# Patient Record
Sex: Female | Born: 1965 | Hispanic: No | Marital: Married | Smoking: Never smoker
Health system: Southern US, Community
[De-identification: ages and names within clinical notes are randomized; demographics above are authoritative.]

## PROBLEM LIST (undated history)

## (undated) DIAGNOSIS — I10 Essential (primary) hypertension: Secondary | ICD-10-CM

## (undated) DIAGNOSIS — O1490 Unspecified pre-eclampsia, unspecified trimester: Secondary | ICD-10-CM

## (undated) DIAGNOSIS — E079 Disorder of thyroid, unspecified: Secondary | ICD-10-CM

## (undated) HISTORY — DX: Disorder of thyroid, unspecified: E07.9

## (undated) HISTORY — DX: Unspecified pre-eclampsia, unspecified trimester: O14.90

## (undated) HISTORY — DX: Essential (primary) hypertension: I10

---

## 2004-04-18 ENCOUNTER — Ambulatory Visit: Payer: Self-pay | Admitting: Internal Medicine

## 2004-09-25 ENCOUNTER — Ambulatory Visit: Payer: Self-pay | Admitting: Internal Medicine

## 2005-06-18 ENCOUNTER — Ambulatory Visit: Payer: Self-pay | Admitting: Internal Medicine

## 2005-06-25 ENCOUNTER — Ambulatory Visit: Payer: Self-pay | Admitting: Internal Medicine

## 2006-02-25 ENCOUNTER — Ambulatory Visit: Payer: Self-pay | Admitting: Internal Medicine

## 2006-03-16 ENCOUNTER — Ambulatory Visit: Payer: Self-pay | Admitting: Internal Medicine

## 2006-03-26 ENCOUNTER — Emergency Department (HOSPITAL_COMMUNITY): Admission: EM | Admit: 2006-03-26 | Discharge: 2006-03-26 | Payer: Self-pay | Admitting: Emergency Medicine

## 2006-03-27 ENCOUNTER — Ambulatory Visit: Payer: Self-pay | Admitting: Internal Medicine

## 2006-04-01 ENCOUNTER — Ambulatory Visit: Payer: Self-pay | Admitting: Internal Medicine

## 2006-04-29 ENCOUNTER — Ambulatory Visit: Payer: Self-pay | Admitting: Internal Medicine

## 2006-07-02 ENCOUNTER — Ambulatory Visit: Payer: Self-pay | Admitting: Internal Medicine

## 2006-07-02 LAB — CONVERTED CEMR LAB
ALT: 31 units/L (ref 0–40)
AST: 27 units/L (ref 0–37)
Albumin: 3.8 g/dL (ref 3.5–5.2)
Alkaline Phosphatase: 96 units/L (ref 39–117)
BUN: 7 mg/dL (ref 6–23)
Basophils Absolute: 0.1 10*3/uL (ref 0.0–0.1)
Basophils Relative: 1 % (ref 0.0–1.0)
CO2: 30 meq/L (ref 19–32)
Calcium: 9.2 mg/dL (ref 8.4–10.5)
Chloride: 105 meq/L (ref 96–112)
Cholesterol: 182 mg/dL (ref 0–200)
Creatinine, Ser: 0.6 mg/dL (ref 0.4–1.2)
Eosinophils Relative: 3.7 % (ref 0.0–5.0)
GFR calc Af Amer: 142 mL/min
GFR calc non Af Amer: 117 mL/min
Glucose, Bld: 94 mg/dL (ref 70–99)
HCT: 38.4 % (ref 36.0–46.0)
HDL: 50.8 mg/dL (ref >39.0)
Hemoglobin: 12.7 g/dL (ref 12.0–15.0)
LDL Cholesterol: 112 mg/dL — ABNORMAL HIGH (ref 0–99)
Lymphocytes Relative: 30.6 % (ref 12.0–46.0)
MCHC: 32.9 g/dL (ref 30.0–36.0)
MCV: 84.3 fL (ref 78.0–100.0)
Monocytes Absolute: 0.6 10*3/uL (ref 0.2–0.7)
Monocytes Relative: 10.5 % (ref 3.0–11.0)
Neutro Abs: 3.1 10*3/uL (ref 1.4–7.7)
Neutrophils Relative %: 54.2 % (ref 43.0–77.0)
Platelets: 366 10*3/uL (ref 150–400)
Potassium: 3.9 meq/L (ref 3.5–5.1)
RBC: 4.56 M/uL (ref 3.87–5.11)
RDW: 12.4 % (ref 11.5–14.6)
Sodium: 141 meq/L (ref 135–145)
TSH: 1.82 microintl units/mL (ref 0.35–5.50)
Total Bilirubin: 0.6 mg/dL (ref 0.3–1.2)
Total CHOL/HDL Ratio: 3.6
Total Protein: 7.2 g/dL (ref 6.0–8.3)
Triglycerides: 97 mg/dL (ref 0–149)
VLDL: 19 mg/dL (ref 0–40)
WBC: 5.7 10*3/uL (ref 4.5–10.5)

## 2006-07-10 ENCOUNTER — Ambulatory Visit: Payer: Self-pay | Admitting: Internal Medicine

## 2006-08-18 ENCOUNTER — Ambulatory Visit: Payer: Self-pay | Admitting: Internal Medicine

## 2006-12-21 ENCOUNTER — Ambulatory Visit: Payer: Self-pay | Admitting: Internal Medicine

## 2006-12-24 DIAGNOSIS — R51 Headache: Secondary | ICD-10-CM

## 2006-12-24 DIAGNOSIS — R519 Headache, unspecified: Secondary | ICD-10-CM | POA: Insufficient documentation

## 2006-12-24 DIAGNOSIS — I10 Essential (primary) hypertension: Secondary | ICD-10-CM | POA: Insufficient documentation

## 2007-04-26 ENCOUNTER — Ambulatory Visit: Payer: Self-pay | Admitting: Internal Medicine

## 2007-04-26 LAB — CONVERTED CEMR LAB: Rapid Strep: NEGATIVE

## 2007-05-25 ENCOUNTER — Ambulatory Visit: Payer: Self-pay | Admitting: Internal Medicine

## 2007-05-25 DIAGNOSIS — R42 Dizziness and giddiness: Secondary | ICD-10-CM | POA: Insufficient documentation

## 2007-06-08 ENCOUNTER — Ambulatory Visit: Payer: Self-pay | Admitting: Internal Medicine

## 2007-06-22 ENCOUNTER — Ambulatory Visit: Payer: Self-pay | Admitting: Internal Medicine

## 2007-06-22 LAB — CONVERTED CEMR LAB
ALT: 39 units/L — ABNORMAL HIGH (ref 0–35)
Alkaline Phosphatase: 67 units/L (ref 39–117)
BUN: 5 mg/dL — ABNORMAL LOW (ref 6–23)
Bilirubin Urine: NEGATIVE
Bilirubin, Direct: 0.1 mg/dL (ref 0.0–0.3)
Calcium: 9 mg/dL (ref 8.4–10.5)
Cholesterol: 178 mg/dL (ref 0–200)
Eosinophils Absolute: 0.5 10*3/uL (ref 0.0–0.6)
GFR calc Af Amer: 141 mL/min
GFR calc non Af Amer: 117 mL/min
HDL: 49.5 mg/dL (ref >39.0)
Ketones, urine, test strip: NEGATIVE
Lymphocytes Relative: 28 % (ref 12.0–46.0)
MCHC: 34.5 g/dL (ref 30.0–36.0)
MCV: 82.3 fL (ref 78.0–100.0)
Monocytes Absolute: 0.8 10*3/uL — ABNORMAL HIGH (ref 0.2–0.7)
Monocytes Relative: 15 % — ABNORMAL HIGH (ref 3.0–11.0)
Neutro Abs: 2.7 10*3/uL (ref 1.4–7.7)
Nitrite: NEGATIVE
Platelets: 248 10*3/uL (ref 150–400)
Potassium: 4.5 meq/L (ref 3.5–5.1)
Specific Gravity, Urine: 1.015
Triglycerides: 82 mg/dL (ref 0–149)
VLDL: 16 mg/dL (ref 0–40)
pH: 5.5

## 2007-06-29 ENCOUNTER — Ambulatory Visit: Payer: Self-pay | Admitting: Internal Medicine

## 2008-02-17 ENCOUNTER — Ambulatory Visit: Payer: Self-pay | Admitting: Family Medicine

## 2008-05-03 ENCOUNTER — Ambulatory Visit: Payer: Self-pay | Admitting: Internal Medicine

## 2008-07-26 ENCOUNTER — Ambulatory Visit: Payer: Self-pay | Admitting: Internal Medicine

## 2008-07-27 ENCOUNTER — Telehealth: Payer: Self-pay | Admitting: Internal Medicine

## 2008-07-27 LAB — CONVERTED CEMR LAB
Basophils Absolute: 0.1 10*3/uL (ref 0.0–0.1)
Basophils Relative: 0.6 % (ref 0.0–3.0)
Eosinophils Relative: 3.8 % (ref 0.0–5.0)
Hemoglobin: 11.9 g/dL — ABNORMAL LOW (ref 12.0–15.0)
Lymphocytes Relative: 23.6 % (ref 12.0–46.0)
MCHC: 34.1 g/dL (ref 30.0–36.0)
MCV: 81.2 fL (ref 78.0–100.0)
Neutro Abs: 5.7 10*3/uL (ref 1.4–7.7)
Neutrophils Relative %: 62.5 % (ref 43.0–77.0)
RBC: 4.29 M/uL (ref 3.87–5.11)
WBC: 9.1 10*3/uL (ref 4.5–10.5)

## 2008-09-04 ENCOUNTER — Ambulatory Visit: Payer: Self-pay | Admitting: Family Medicine

## 2008-09-04 ENCOUNTER — Telehealth: Payer: Self-pay | Admitting: Family Medicine

## 2008-10-30 ENCOUNTER — Ambulatory Visit: Payer: Self-pay | Admitting: Internal Medicine

## 2008-10-30 LAB — CONVERTED CEMR LAB
AST: 23 units/L (ref 0–37)
Alkaline Phosphatase: 50 units/L (ref 39–117)
Basophils Relative: 0.9 % (ref 0.0–3.0)
Bilirubin, Direct: 0 mg/dL (ref 0.0–0.3)
Blood in Urine, dipstick: NEGATIVE
Calcium: 9 mg/dL (ref 8.4–10.5)
Creatinine, Ser: 0.6 mg/dL (ref 0.4–1.2)
Eosinophils Absolute: 0.3 10*3/uL (ref 0.0–0.7)
GFR calc non Af Amer: 115.52 mL/min (ref >60)
HDL: 59 mg/dL (ref >39.00)
Hemoglobin: 10.6 g/dL — ABNORMAL LOW (ref 12.0–15.0)
Ketones, urine, test strip: NEGATIVE
LDL Cholesterol: 122 mg/dL — ABNORMAL HIGH (ref 0–99)
Lymphocytes Relative: 31.2 % (ref 12.0–46.0)
MCHC: 33.6 g/dL (ref 30.0–36.0)
Monocytes Relative: 9.9 % (ref 3.0–12.0)
Neutro Abs: 2.9 10*3/uL (ref 1.4–7.7)
Neutrophils Relative %: 53.4 % (ref 43.0–77.0)
Nitrite: NEGATIVE
RBC: 3.92 M/uL (ref 3.87–5.11)
Sodium: 144 meq/L (ref 135–145)
Specific Gravity, Urine: 1.015
Total CHOL/HDL Ratio: 3
Total Protein: 7.2 g/dL (ref 6.0–8.3)
Triglycerides: 67 mg/dL (ref 0.0–149.0)
Urobilinogen, UA: 0.2
VLDL: 13.4 mg/dL (ref 0.0–40.0)
WBC Urine, dipstick: NEGATIVE
WBC: 5.7 10*3/uL (ref 4.5–10.5)

## 2008-12-04 ENCOUNTER — Ambulatory Visit: Payer: Self-pay | Admitting: Internal Medicine

## 2008-12-04 DIAGNOSIS — D649 Anemia, unspecified: Secondary | ICD-10-CM

## 2009-03-19 ENCOUNTER — Ambulatory Visit: Payer: Self-pay | Admitting: Family Medicine

## 2009-03-19 DIAGNOSIS — N390 Urinary tract infection, site not specified: Secondary | ICD-10-CM

## 2009-03-19 LAB — CONVERTED CEMR LAB
Bilirubin Urine: NEGATIVE
Glucose, Urine, Semiquant: NEGATIVE
Protein, U semiquant: NEGATIVE
Urobilinogen, UA: 0.2
pH: 7

## 2010-07-02 ENCOUNTER — Ambulatory Visit
Admission: RE | Admit: 2010-07-02 | Discharge: 2010-07-02 | Payer: Self-pay | Source: Home / Self Care | Attending: Internal Medicine | Admitting: Internal Medicine

## 2010-07-02 DIAGNOSIS — E236 Other disorders of pituitary gland: Secondary | ICD-10-CM | POA: Insufficient documentation

## 2010-07-08 ENCOUNTER — Ambulatory Visit
Admission: RE | Admit: 2010-07-08 | Discharge: 2010-07-08 | Payer: Self-pay | Source: Home / Self Care | Attending: Internal Medicine | Admitting: Internal Medicine

## 2010-07-16 ENCOUNTER — Encounter: Payer: Self-pay | Admitting: Endocrinology

## 2010-07-16 ENCOUNTER — Ambulatory Visit
Admission: RE | Admit: 2010-07-16 | Discharge: 2010-07-16 | Payer: Self-pay | Source: Home / Self Care | Attending: Endocrinology | Admitting: Endocrinology

## 2010-07-16 ENCOUNTER — Other Ambulatory Visit: Payer: Self-pay | Admitting: Endocrinology

## 2010-07-16 DIAGNOSIS — D353 Benign neoplasm of craniopharyngeal duct: Secondary | ICD-10-CM | POA: Insufficient documentation

## 2010-07-16 DIAGNOSIS — D352 Benign neoplasm of pituitary gland: Secondary | ICD-10-CM

## 2010-07-16 DIAGNOSIS — E229 Hyperfunction of pituitary gland, unspecified: Secondary | ICD-10-CM | POA: Insufficient documentation

## 2010-07-16 LAB — T4, FREE: Free T4: 0.7 ng/dL (ref 0.60–1.60)

## 2010-07-16 LAB — BASIC METABOLIC PANEL
CO2: 30 mEq/L (ref 19–32)
Chloride: 103 mEq/L (ref 96–112)
Glucose, Bld: 93 mg/dL (ref 70–99)
Sodium: 140 mEq/L (ref 135–145)

## 2010-07-16 LAB — CONVERTED CEMR LAB: Prolactin: 15.5 ng/mL

## 2010-07-18 NOTE — Assessment & Plan Note (Signed)
Summary: fup re: migraines/cjr   Vital Signs:  Patient profile:   45 year old female Weight:      161 pounds Pulse rate:   72 / minute Pulse rhythm:   regular BP sitting:   120 / 80  (left arm) Cuff size:   regular  Vitals Entered By: Kyung Rudd, CMA (July 08, 2010 8:36 AM) CC: f/u migraines   CC:  f/u migraines.  History of Present Illness: Pt presents to clinic for followup of ha's. States imitrex does help relieve ha's x8 hours but headaches return. No side effects with imitrex. Ha's remain left hemicranial/retro-orbital with associated nausea. Does have intermittent left facial and left arm paresthesias with ha's but denies visual changes, weakness or difficulty with speech. Previous bp was elevated but was with pain. Home bp checks have been nl. H/o ?pituitary adenoma lost to followup. Has endocrine appt 1/31. No recent cranial imaging.  Current Medications (verified): 1)  Multivitamins   Tabs (Multiple Vitamin) .... Once Daily 2)  Tylenol 325 Mg Tabs (Acetaminophen) .... As Needed 3)  Imitrex 50 Mg Tabs (Sumatriptan Succinate) .... One At Onset of Ha. May Repeat in 2 Hour As Needed Ha. Max 200mg /24 Hours  Allergies (verified): 1)  Penicillin G Potassium (Penicillin G Potassium)  Review of Systems      See HPI  Physical Exam  General:  Well-developed,well-nourished,in no acute distress; alert,appropriate and cooperative throughout examination   Impression & Recommendations:  Problem # 1:  HEADACHE (ICD-784.0) Assessment Improved  continue Imitrex.  begin Elavil 25 mg each bedtime.  if no cranial imaging through endocrinology will recommend cranial MRI.     Imitrex 50 Mg Tabs (Sumatriptan succinate) ..... One at onset of ha. may repeat in 2 hour as needed ha. 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 adenoma to recent headaches.  endocrinology  appointment pending.  Problem # 3:  ELEVATED BLOOD PRESSURE (ICD-796.2) Assessment: Improved  resolved. Likely contribution from pain. home blood pressure  normal.  Complete Medication List: 1)  Multivitamins Tabs (Multiple vitamin) .... Once daily 2)  Tylenol 325 Mg Tabs (Acetaminophen) .... As needed 3)  Imitrex 50 Mg Tabs (Sumatriptan succinate) .... One at onset of ha. may repeat in 2 hour as needed ha. max 200mg /24 hours 4)  Amitriptyline Hcl 25 Mg Tabs (Amitriptyline hcl) .... One by mouth qhs Prescriptions: AMITRIPTYLINE HCL 25 MG TABS (AMITRIPTYLINE HCL) one by mouth qhs  #30 x 3   Entered and Authorized by:   Edwyna Perfect MD   Signed by:   Edwyna Perfect MD on 07/08/2010   Method used:   Print then Give to Patient   RxID:   [id number] IMITREX 50 MG TABS (SUMATRIPTAN SUCCINATE) one at onset of ha. may repeat in 2 hour as needed ha. max 200mg /24 hours  #12 x 3   Entered and Authorized by:   Edwyna Perfect MD   Signed by:   Edwyna Perfect MD on 07/08/2010   Method used:   Print then Give to Patient   RxID:   [id number]    Orders Added: 1)  Est. Patient Level III [53664]

## 2010-07-18 NOTE — Assessment & Plan Note (Signed)
Summary: MIGRAINE H/A ? // RS   Vital Signs:  Patient profile:   45 year old female Weight:      162 pounds Pulse rate:   68 / minute BP sitting:   160 / 90  (left arm)  Vitals Entered By: Kyung Rudd, CMA (July 02, 2010 10:42 AM) CC: pt c/o migraine on left side of head x 5days accompanied by nausea   CC:  pt c/o migraine on left side of head x 5days accompanied by nausea.  History of Present Illness: Patient presents to clinic as a workin for evaluation of headache. States 5d h/o left hemicranial and retorbital headache without radiation. +associated nausea without emesis and possibly mild photophobia.  Denies current neurologic deficit (visual disturbances, decreased visual acuity, difficulty with speech, numbness, tingling, or focal weakness). Does have h/o intermittent ha's and states in December has transient episode of left arm numbness associated with headache that resolved with ha and has not recurred. Was told was related to migraine ha by work clinic. Has attempted excedrin migraine with no improvement.  Does recall unspecified pituitary abnormality (?pit microadenoma) previously followed by specialist with labwork and cranial imaging. States has not followed up in several years. Denies visual field deficits.  BP reviewed elevated and is typically normotensive. Is currently in pain.   Current Medications (verified): 1)  Multivitamins   Tabs (Multiple Vitamin) .... Once Daily 2)  Tylenol 325 Mg Tabs (Acetaminophen) .... As Needed  Allergies (verified): 1)  Penicillin G Potassium (Penicillin G Potassium)  Past History:  Past medical, surgical, family and social histories (including risk factors) reviewed, and no changes noted (except as noted below).  Past Medical History: Reviewed history from 04/26/2007 and no changes required. Hypertension preeclampsia  Family History: Reviewed history from 12/04/2008 and no changes required. father- htn, smoker, lung  CA mother- OA, lipids, htn , smoker Family History High cholesterol Family History Hypertension  Social History: Reviewed history from 12/04/2008 and no changes required. Married Never Smoked Regular exercise-no Occupation:  Review of Systems      See HPI  Physical Exam  General:  Well-developed,well-nourished,in no acute distress; alert,appropriate and cooperative throughout examination Head:  Normocephalic and atraumatic without obvious abnormalities. No apparent alopecia or balding. Eyes:  vision grossly intact, pupils equal, pupils round, pupils reactive to light, pupils react to accomodation, corneas and lenses clear, and no injection.   Ears:  no external deformities.   Nose:  no external deformity.   Mouth:  Oral mucosa and oropharynx without lesions or exudates.  Teeth in good repair. Neurologic:  alert & oriented X3, cranial nerves II-XII intact, strength normal in all extremities, gait normal, and finger-to-nose normal.  No visual field cuts noted on exam.   Impression & Recommendations:  Problem # 1:  HEADACHE (ICD-784.0) Assessment New Neurologically nonfocal. Toradol injxn given today. Attempt imitrex as needed. Phenergan as needed and cautioned regarding possible sedating effect. Arrange for close followup.  Her updated medication list for this problem includes:    Tylenol 325 Mg Tabs (Acetaminophen) .Marland Kitchen... As needed    Imitrex 50 Mg Tabs (Sumatriptan succinate) ..... One at onset of ha. may repeat in 2 hour as needed ha. 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 Unclear if related to recent headache pattern. Schedule followup with endocrinology with likely cranial imaging. Orders: Endocrinology Referral (Endocrine)  Problem # 3:  ELEVATED BLOOD PRESSURE (ICD-796.2) Assessment: New  Complete Medication List: 1)  Multivitamins Tabs  (Multiple vitamin) .... Once daily 2)  Tylenol 325 Mg Tabs (Acetaminophen) .... As needed 3)  Promethazine Hcl 25 Mg Tabs (Promethazine hcl) .... One by mouth q4-6 hours as needed n/v 4)  Imitrex 50 Mg Tabs (Sumatriptan succinate) .... One at onset of ha. may repeat in 2 hour as needed ha. max 200mg /24 hours  Patient Instructions: 1)  Please schedule appointment with Dr. Rodena Medin next Monday Prescriptions: IMITREX 50 MG TABS (SUMATRIPTAN SUCCINATE) one at onset of ha. may repeat in 2 hour as needed ha. max 200mg /24 hours  #6 x 1   Entered and Authorized by:   Edwyna Perfect MD   Signed by:   Edwyna Perfect MD on 07/02/2010   Method used:   Print then Give to Patient   RxID:   [id number] PROMETHAZINE HCL 25 MG TABS (PROMETHAZINE HCL) one by mouth q4-6 hours as needed n/v  #20 x 0   Entered and Authorized by:   Edwyna Perfect MD   Signed by:   Edwyna Perfect MD on 07/02/2010   Method used:   Print then Give to Patient   RxID:   [id number]    Orders Added: 1)  Ketorolac-Toradol 15mg  [Q4696] 2)  Endocrinology Referral [Endocrine] 3)  Est. Patient Level IV [29528]  Appended Document: MIGRAINE H/A ? // RS    Clinical Lists Changes  Orders: Added new Service order of Admin of Therapeutic Inj  intramuscular or subcutaneous (41324) - Signed Added new Service order of Ketorolac-Toradol 15mg  (M0102) - Signed         Medication Administration  Injection # 1:    Medication: Ketorolac-Toradol 15mg     Diagnosis: HEADACHE (ICD-784.0)    Route: IM    Site: RUOQ gluteus    Exp Date: 08/15/2011    Lot #: [id number]    Mfr: [employer]    Comments: given 60mg     Patient tolerated injection without complications    Given by: Kyung Rudd, CMA (July 02, 2010 11:33 AM)  Orders Added: 1)  Admin of Therapeutic Inj  intramuscular or subcutaneous [96372] 2)  Ketorolac-Toradol 15mg  [Q0347]

## 2010-07-22 ENCOUNTER — Other Ambulatory Visit: Payer: Self-pay | Admitting: Endocrinology

## 2010-07-22 DIAGNOSIS — E237 Disorder of pituitary gland, unspecified: Secondary | ICD-10-CM

## 2010-07-25 ENCOUNTER — Other Ambulatory Visit: Payer: Self-pay | Admitting: Endocrinology

## 2010-07-25 DIAGNOSIS — E236 Other disorders of pituitary gland: Secondary | ICD-10-CM

## 2010-07-26 ENCOUNTER — Other Ambulatory Visit (HOSPITAL_COMMUNITY): Payer: Self-pay

## 2010-07-30 ENCOUNTER — Ambulatory Visit
Admission: RE | Admit: 2010-07-30 | Discharge: 2010-07-30 | Disposition: A | Discharge status: Home / Self Care | Payer: Managed Care, Other (non HMO) | Source: Ambulatory Visit | Attending: Endocrinology | Admitting: Endocrinology

## 2010-07-30 DIAGNOSIS — E236 Other disorders of pituitary gland: Secondary | ICD-10-CM

## 2010-07-30 MED ORDER — GADOBENATE DIMEGLUMINE 529 MG/ML IV SOLN
9.0000 mL | Freq: Once | INTRAVENOUS | Status: AC | PRN
  Administered 2010-07-30: 9 mL via INTRAVENOUS

## 2010-08-01 NOTE — Assessment & Plan Note (Signed)
Summary: NEW ENDO/[employer]/HX PITUATARY ABDNORMALITY/#/LB   Vital Signs:  Patient profile:   45 year old female Height:      66.25 inches (168.28 cm) Weight:      163.38 pounds (74.26 kg) BMI:     26.27 O2 Sat:      98 % on Room air Temp:     99.4 degrees F (37.44 degrees C) oral Pulse rate:   80 / minute Pulse rhythm:   regular BP sitting:   102 / 76  (left arm) Cuff size:   regular  Vitals Entered By: Brenton Grills CMA (AAMA) (July 16, 2010 1:30 PM)  O2 Flow:  Room air CC: New Endo Consult/Pituitary Abnormality/Dr. Swords/aj Is Patient Diabetic? Yes   CC:  New Endo Consult/Pituitary Abnormality/Dr. Swords/aj.  History of Present Illness: pt says she was dx'ed with elev prolactin, approx 20 years ago, when she presented with headache.  she was rx'ed with parlodel x approx 1 month.  this resolved the headaches.  she came to [place] in 1998.  she [facility]x [facility], which showed a few mm abnormality.  she was rx'ed a different medication, which she took x 1 week only.   she still has menses.  no galactorrhea.   she reports 2 mos of pain (was severe, now mild) at the left temporal-frontal area, but no assoc viaual loss.  imitrex helps.    Current Medications (verified): 1)  Multivitamins   Tabs (Multiple Vitamin) .... Once Daily 2)  Tylenol 325 Mg Tabs (Acetaminophen) .... As Needed 3)  Imitrex 50 Mg Tabs (Sumatriptan Succinate) .... One At Onset of Ha. May Repeat in 2 Hour As Needed Ha. Max 200mg /24 Hours 4)  Amitriptyline Hcl 25 Mg Tabs (Amitriptyline Hcl) .... One By Mouth Qhs  Allergies (verified): 1)  Penicillin G Potassium (Penicillin G Potassium)  Past History:  Past Medical History: Last updated: 04/26/2007 Hypertension preeclampsia  Social History: Reviewed history from 12/04/2008 and no changes required. Married Never Smoked Regular exercise-no Occupation: importer  Review of Systems       denies polyuria, syncope, rash, depression,  galactorrhea, doe, chest pain, weight change, easy bruising, change in facial appearance, and n/v.   no galactorrhea.  she has intermittent rhinorrhea.  Physical Exam  Neck:  Supple without thyroid enlargement or tenderness.  Additional Exam:  Prolactin                 15.5 ng/mL Sodium                    140 mEq/L                   135-145   Potassium                 4.6 mEq/L                   3.5-5.1   Chloride                  103 mEq/L                   96-112   Carbon Dioxide            30 mEq/L                    19-32   Glucose  93 mg/dL                    08-65   BUN                       10 mg/dL                    7-84   Creatinine                0.7 mg/dL                   6.9-6.2   Calcium                   9.4 mg/dL                   9.5-28.4  FastTSH                   1.50 uIU/mL                 0.35-5.50  Free T4                   0.70 ng/dL        Impression & Recommendations:  Problem # 1:  HYPERPROLACTINEMIA (ICD-253.1) Assessment Improved  Problem # 2:  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  please call [phone] to hear your test results. 2)  recheck mri of the pituitary.  you will be called with a day and time for an appointment. 3)  please sign release of information for the mri approx 12 years ago.   4)  (update: i left message on phone-tree:  prolactin is normal.  rx as we discussed)   Orders Added: 1)  T-Prolactin [[id number]] 2)  TLB-BMP (Basic Metabolic Panel-BMET) [80048-METABOL] 3)  TLB-TSH (Thyroid Stimulating Hormone) [84443-TSH] 4)   TLB-T4 (Thyrox), Free [41660-YT0Z] 5)  Radiology Referral [Radiology] 6)  Consultation Level IV [60109]

## 2010-10-21 ENCOUNTER — Ambulatory Visit (INDEPENDENT_AMBULATORY_CARE_PROVIDER_SITE_OTHER): Payer: Managed Care, Other (non HMO) | Admitting: Internal Medicine

## 2010-10-21 ENCOUNTER — Encounter: Payer: Self-pay | Admitting: Internal Medicine

## 2010-10-21 DIAGNOSIS — M79605 Pain in left leg: Secondary | ICD-10-CM | POA: Insufficient documentation

## 2010-10-21 DIAGNOSIS — M79671 Pain in right foot: Secondary | ICD-10-CM | POA: Insufficient documentation

## 2010-10-21 DIAGNOSIS — M79609 Pain in unspecified limb: Secondary | ICD-10-CM

## 2010-10-21 MED ORDER — DICLOFENAC SODIUM 75 MG PO TBEC: DELAYED_RELEASE_TABLET | ORAL | Status: AC

## 2010-10-21 NOTE — Progress Notes (Signed)
  Subjective:    Patient ID: Jill Hansen, female    DOB: [birth year], 45 y.o.   MRN: [id number]  HPIPt presents to clinic for evaluation of foot pain. Notes a one-week history of left anterior upper thigh pain that radiates down the leg.-like symptoms began after exercising on the elliptical trainer. Denies radicular back pain numbness tingling or weakness. There is no injury or trauma. Also notes right foot first metatarsal plantar head pain. No injury or trauma. Worse with weightbearing. Attempted over-the-counter inflammatories for several dosages without improvement. No other alleviating or exacerbating factors. No known history of PUD or chronic renal insufficiency. No other complaints  Reviewed past medical history, medications and allergies.  Review of Systems  Musculoskeletal: Positive for arthralgias. Negative for myalgias, back pain, joint swelling and gait problem.  Skin: Negative for color change, pallor, rash and wound.       Objective:   Physical Exam  [nursing notereviewed. Constitutional: She appears well-developed and well-nourished. No distress.  HENT:  Head: Normocephalic and atraumatic.  Right Ear: External ear normal.  Left Ear: External ear normal.  Eyes: Conjunctivae are normal. No scleral icterus.  Musculoskeletal:       Left upper leg exam: Full range of motion at hip joint without click or reproducible pain. Flexion extension internal and external rotation examined. Reproducible tenderness along the anterior flexor muscles. No soft tissue mass. Right foot exam: No erythema warmth or effusion. Full range of motion. Her weight and ambulate without assistance. Point tenderness along the first metatarsal head plantar aspect. Minimal fat pad first metatarsal head.   Neurological: She is alert.  Skin: Skin is warm and dry. No rash noted. She is not diaphoretic. No erythema.  Psychiatric: She has a normal mood and affect.          Assessment & Plan:

## 2010-10-21 NOTE — Assessment & Plan Note (Signed)
Hold exercise temporarily. shoe insert recommended. Attempt course of anti-inflammatory with food and no other anti-inflammatories.Followup if no improvement or worsening.

## 2010-10-21 NOTE — Assessment & Plan Note (Addendum)
Suspect inflammed hip flexor muscles with recent change in exercise. Temporary hold on exercise. Anti-inflammatory as above. Followup if no improvement or worsening.

## 2010-11-05 ENCOUNTER — Encounter: Payer: Self-pay | Admitting: Internal Medicine

## 2011-01-24 ENCOUNTER — Ambulatory Visit (INDEPENDENT_AMBULATORY_CARE_PROVIDER_SITE_OTHER): Payer: Managed Care, Other (non HMO) | Admitting: Family Medicine

## 2011-01-24 ENCOUNTER — Encounter: Payer: Self-pay | Admitting: Family Medicine

## 2011-01-24 VITALS — BP 110/80 | Temp 98.4°F | Wt 164.0 lb

## 2011-01-24 DIAGNOSIS — M25559 Pain in unspecified hip: Secondary | ICD-10-CM

## 2011-01-24 MED ORDER — DICLOFENAC SODIUM 75 MG PO TBEC: 75.0000 mg | DELAYED_RELEASE_TABLET | Freq: Two times a day (BID) | ORAL | Status: AC

## 2011-01-24 NOTE — Progress Notes (Signed)
  Subjective:    Patient ID: Jill Hansen, female    DOB: March 06, 1966, 45 y.o.   MRN: [id number]  HPI Recurrent pains left anterior thigh region predominantly. She has occasional left lower back pain but she's not sure this is the same type of pain. Refer to prior note. Concern for probable left hip flexor strain.  Took diclofenac with almost total resolution of pain. Denies specific injury but frequently carries her 45 pound child on left hip. Pain worse with walking. No pain at rest. Denies numbness or weakness. Location is left anterior hip around proximal insertion of sartorius with some radiation to the mid thigh.  No radiculopathy symptoms. No urine incontinence. Denies fever, chills, appetite, or weight changes. No alleviating factors. Exacerbated by walking and hip flexion.  Past Medical History  Diagnosis Date  . Hypertension   . Preeclampsia    No past surgical history on file.  reports that she has never smoked. She does not have any smokeless tobacco history on file. Her alcohol and drug histories not on file. family history includes Cancer in her father; Hyperlipidemia in her mother and other; Hypertension in her father, mother, and other; and Osteoarthritis in her mother. Allergies  Allergen Reactions  . Penicillins     REACTION: swelling      Review of Systems  Constitutional: Negative for fever, appetite change and unexpected weight change.  Cardiovascular: Negative for leg swelling.  Genitourinary: Negative for dysuria.  Musculoskeletal: Positive for back pain. Negative for gait problem.  Skin: Negative for rash.  Hematological: Negative for adenopathy. Does not bruise/bleed easily.       Objective:   Physical Exam  Constitutional: She appears well-developed and well-nourished.  Cardiovascular: Normal rate and regular rhythm.   Pulmonary/Chest: Effort normal and breath sounds normal. No respiratory distress. She has no wheezes.  Musculoskeletal: She exhibits no  edema.       No leg or thigh edema. She has excellent range of motion left hip with internal and external rotation. Mild anterior hip pain with left hip flexion against resistance. No pain with knee extension.  Good distal foot pulses. Both feet are warm to touch with good capillary refill.  Neurological:       Deep tendon reflexes 2+ knee and ankle bilaterally. Full-strength lower extremities  Skin: No rash noted.  Psychiatric: She has a normal mood and affect. Her behavior is normal.          Assessment & Plan:  Recurrent left anterior hip pain. Suspect hip flexor pain. Given duration of symptoms complete left hip films. Refill diclofenac which helped previously.  If X-rays negative recommend trial of physical therapy

## 2011-01-31 ENCOUNTER — Ambulatory Visit (INDEPENDENT_AMBULATORY_CARE_PROVIDER_SITE_OTHER)
Admission: RE | Admit: 2011-01-31 | Discharge: 2011-01-31 | Disposition: A | Discharge status: Home / Self Care | Payer: Managed Care, Other (non HMO) | Source: Ambulatory Visit | Attending: Family Medicine | Admitting: Family Medicine

## 2011-01-31 DIAGNOSIS — M25559 Pain in unspecified hip: Secondary | ICD-10-CM

## 2011-02-03 NOTE — Progress Notes (Signed)
Quick Note:  Pt informed and she will call back if pain persists for PT ______

## 2011-05-28 ENCOUNTER — Ambulatory Visit (INDEPENDENT_AMBULATORY_CARE_PROVIDER_SITE_OTHER): Payer: Managed Care, Other (non HMO) | Admitting: Family Medicine

## 2011-05-28 ENCOUNTER — Encounter: Payer: Self-pay | Admitting: Family Medicine

## 2011-05-28 VITALS — BP 128/90 | HR 122 | Temp 99.2°F | Wt 164.0 lb

## 2011-05-28 DIAGNOSIS — Z20828 Contact with and (suspected) exposure to other viral communicable diseases: Secondary | ICD-10-CM

## 2011-05-28 DIAGNOSIS — J111 Influenza due to unidentified influenza virus with other respiratory manifestations: Secondary | ICD-10-CM

## 2011-05-28 DIAGNOSIS — J101 Influenza due to other identified influenza virus with other respiratory manifestations: Secondary | ICD-10-CM

## 2011-05-28 LAB — POCT INFLUENZA A/B
Influenza A, POC: POSITIVE
Influenza B, POC: POSITIVE

## 2011-05-28 MED ORDER — OSELTAMIVIR PHOSPHATE 75 MG PO CAPS: 75.0000 mg | ORAL_CAPSULE | Freq: Two times a day (BID) | ORAL | Status: AC

## 2011-05-28 NOTE — Patient Instructions (Signed)
Influenza Facts Flu (influenza) is a contagious respiratory illness caused by the influenza viruses. It can cause mild to severe illness. While most healthy people recover from the flu without specific treatment and without complications, older people, young children, and people with certain health conditions are at higher risk for serious complications from the flu, including death. CAUSES   The flu virus is spread from person to person by respiratory droplets from coughing and sneezing.   A person can also become infected by touching an object or surface with a virus on it and then touching their mouth, eye or nose.   Adults may be able to infect others from 1 day before symptoms occur and up to 7 days after getting sick. So it is possible to give someone the flu even before you know you are sick and continue to infect others while you are sick.  SYMPTOMS   Fever (usually high).   Headache.   Tiredness (can be extreme).   Cough.   Sore throat.   Runny or stuffy nose.   Body aches.   Diarrhea and vomiting may also occur, particularly in children.   These symptoms are referred to as "flu-like symptoms". A lot of different illnesses, including the common cold, can have similar symptoms.  DIAGNOSIS   There are tests that can determine if you have the flu as long you are tested within the first 2 or 3 days of illness.   A doctor's exam and additional tests may be needed to identify if you have a disease that is a complicating the flu.  RISKS AND COMPLICATIONS  Some of the complications caused by the flu include:  Bacterial pneumonia or progressive pneumonia caused by the flu virus.   Loss of body fluids (dehydration).   Worsening of chronic medical conditions, such as heart failure, asthma, or diabetes.   Sinus problems and ear infections.  HOME CARE INSTRUCTIONS   Seek medical care early on.   If you are at high risk from complications of the flu, consult your health-care  provider as soon as you develop flu-like symptoms. Those at high risk for complications include:   People 65 years or older.   People with chronic medical conditions, including diabetes.   Pregnant women.   Young children.   Your caregiver may recommend use of an antiviral medication to help treat the flu.   If you get the flu, get plenty of rest, drink a lot of liquids, and avoid using alcohol and tobacco.   You can take over-the-counter medications to relieve the symptoms of the flu if your caregiver approves. (Never give aspirin to children or teenagers who have flu-like symptoms, particularly fever).  PREVENTION  The single best way to prevent the flu is to get a flu vaccine each fall. Other measures that can help protect against the flu are:  Antiviral Medications   A number of antiviral drugs are approved for use in preventing the flu. These are prescription medications, and a doctor should be consulted before they are used.   Habits for Good Health   Cover your nose and mouth with a tissue when you cough or sneeze, throw the tissue away after you use it.   Wash your hands often with soap and water, especially after you cough or sneeze. If you are not near water, use an alcohol-based hand cleaner.   Avoid people who are sick.   If you get the flu, stay home from work or school. Avoid contact with   other people so that you do not make them sick, too.   Try not to touch your eyes, nose, or mouth as germs ore often spread this way.  IN CHILDREN, EMERGENCY WARNING SIGNS THAT NEED URGENT MEDICAL ATTENTION:  Fast breathing or trouble breathing.   Bluish skin color.   Not drinking enough fluids.   Not waking up or not interacting.   Being so irritable that the child does not want to be held.   Flu-like symptoms improve but then return with fever and worse cough.   Fever with a rash.  IN ADULTS, EMERGENCY WARNING SIGNS THAT NEED URGENT MEDICAL ATTENTION:  Difficulty  breathing or shortness of breath.   Pain or pressure in the chest or abdomen.   Sudden dizziness.   Confusion.   Severe or persistent vomiting.  SEEK IMMEDIATE MEDICAL CARE IF:  You or someone you know is experiencing any of the symptoms above. When you arrive at the emergency center,report that you think you have the flu. You may be asked to wear a mask and/or sit in a secluded area to protect others from getting sick. MAKE SURE YOU:   Understand these instructions.   Monitor your condition.   Seek medical care if you are getting worse, or not improving.  Document Released: 06/05/2003 Document Revised: 02/12/2011 Document Reviewed: 03/01/2009 [employer] Patient Information 2012 [employer]. 

## 2011-05-28 NOTE — Progress Notes (Signed)
  Subjective:    Patient ID: Jill Hansen, female    DOB: 1966/03/15, 45 y.o.   MRN: [id number]  HPI 45 year old female nonsmoker, and with a one-day history of headaches, sinus pressure and pain, sneezing, cough and congestion, fever, and chills. She's been taking over-the-counter cold and cough medication that has not helped her symptoms. S will bother as stated above he is concerned because she has a daughter who tested positive for influenza A. She denies any nausea or vomiting no chest pain shortness of breath or edema. She has a past medical history of acute sinusitis.   Review of Systems Review of systems as previously stated Past Medical History  Diagnosis Date  . Hypertension   . Preeclampsia     History   Social History  . Marital Status: Married    Spouse Name: N/A    Number of Children: N/A  . Years of Education: N/A   Occupational History  . Not on file.   Social History Main Topics  . Smoking status: Never Smoker   . Smokeless tobacco: Not on file  . Alcohol Use: Not on file  . Drug Use: Not on file  . Sexually Active: Not on file   Other Topics Concern  . Not on file   Social History Narrative  . No narrative on file    No past surgical history on file.  Family History  Problem Relation Age of Onset  . Osteoarthritis Mother   . Hypertension Mother   . Hyperlipidemia Mother   . Hypertension Father   . Cancer Father     lung  . Hyperlipidemia Other   . Hypertension Other     Allergies  Allergen Reactions  . Penicillins     REACTION: swelling    Current Outpatient Prescriptions on File Prior to Visit  Medication Sig Dispense Refill  . acetaminophen (TYLENOL) 325 MG tablet Take 650 mg by mouth every 6 (six) hours as needed.        . multivitamin (THERAGRAN) per tablet Take 1 tablet by mouth daily.        . SUMAtriptan (IMITREX) 50 MG tablet 50 mg. One at onset of headache may repeat in 2 hours as needed       . Vitamin D, Ergocalciferol,  (DRISDOL) 50000 UNITS CAPS       . amitriptyline (ELAVIL) 25 MG tablet Take 25 mg by mouth at bedtime.        . diclofenac (VOLTAREN) 75 MG EC tablet Take 1 tablet (75 mg total) by mouth 2 (two) times daily.  60 tablet  0    BP 128/90  Pulse 122  Temp(Src) 99.2 F (37.3 C) (Oral)  Wt 164 lb (74.39 kg)chart    Objective:   Physical Exam Patient is alert and oriented in no acute distress. HEENT ears are clear pharynx is normal, clear nasal drainage, sinus tenderness to palpation of the maxillary sinuses. Neck: No lymphadenopathy. Lungs are clear. Cardiac: Regular rate and rhythm, no murmurs rubs or gallops. Skin: Warm and dry, no cyanosis.  Influenza test: Positive for influenza A.    Assesessment & Plan:   Assessment: Influenza A.  Plan: Tamiflu 75 mg one by mouth twice a day for 5 days. Over-the-counter symptomatic treatment for relief. Tylenol and Motrin alternating when necessary. Rest. Drink plenty of fluids. Call if symptoms worsen or persist

## 2012-07-28 ENCOUNTER — Ambulatory Visit (INDEPENDENT_AMBULATORY_CARE_PROVIDER_SITE_OTHER): Payer: Managed Care, Other (non HMO) | Admitting: Family Medicine

## 2012-07-28 ENCOUNTER — Encounter: Payer: Self-pay | Admitting: Family Medicine

## 2012-07-28 VITALS — BP 138/88 | HR 96 | Temp 98.3°F | Wt 161.0 lb

## 2012-07-28 DIAGNOSIS — J329 Chronic sinusitis, unspecified: Secondary | ICD-10-CM

## 2012-07-28 MED ORDER — AZITHROMYCIN 250 MG PO TABS: ORAL_TABLET | ORAL | Status: AC

## 2012-07-28 NOTE — Patient Instructions (Addendum)
INSTRUCTIONS FOR UPPER RESPIRATORY INFECTION:  -As we discussed, we have prescribed a new medication (AZITHROMYCIN) for you at this appointment. We discussed the common and serious potential adverse effects of this medication and you can review these and more with the pharmacist when you pick up your medication.  Please follow the instructions for use carefully and notify us immediately if you have any problems taking this medication.  -plenty of rest and fluids  -nasal saline wash 2-3 times daily (use prepackaged nasal saline or bottled/distilled water if making your own)   -can use sinex nasal spray for drainage and nasal congestion - but do NOT use longer then 3-4 days  -can use tylenol or ibuprofen as directed for aches and sorethroat  -in the winter time, using a humidifier at night is helpful (please follow cleaning instructions)  -if you are taking a cough medication - use only as directed, may also try a teaspoon of honey to coat the throat and throat lozenges  -for sore throat, salt water gargles can help  -follow up if you have fevers, facial pain, tooth pain, difficulty breathing or are worsening or not getting better in 5-7 days

## 2012-07-28 NOTE — Progress Notes (Signed)
Chief Complaint  Patient presents with  . Sinusitis    sinus pressure, facial pain, teeh hurt, headache, heaviness in chest x 3 weeks     HPI:  Started about 3 weeks ago with a cold - but now has progessed to sinus pressure Symptoms: nasal congestion, sinus pressure and R max sinus pressure with R max sinus pain, cough, drainage in throat, deep cough and feels like has chest congestion Denies: fever, NVD, SOB Hx of sinusitis - last one about 1 year ago - reports zpak always works for this  ROS: See pertinent positives and negatives per HPI.  Past Medical History  Diagnosis Date  . Hypertension   . Preeclampsia     Family History  Problem Relation Age of Onset  . Osteoarthritis Mother   . Hypertension Mother   . Hyperlipidemia Mother   . Hypertension Father   . Cancer Father     lung  . Hyperlipidemia Other   . Hypertension Other     History   Social History  . Marital Status: Married    Spouse Name: N/A    Number of Children: N/A  . Years of Education: N/A   Social History Main Topics  . Smoking status: Never Smoker   . Smokeless tobacco: None  . Alcohol Use: None  . Drug Use: None  . Sexually Active: None   Other Topics Concern  . None   Social History Narrative  . None    Current outpatient prescriptions:acetaminophen (TYLENOL) 325 MG tablet, Take 650 mg by mouth every 6 (six) hours as needed.  , Disp: , Rfl: ;  amitriptyline (ELAVIL) 25 MG tablet, Take 25 mg by mouth at bedtime.  , Disp: , Rfl: ;  diclofenac (VOLTAREN) 75 MG EC tablet, Take 1 tablet (75 mg total) by mouth 2 (two) times daily., Disp: 60 tablet, Rfl: 0;  multivitamin (THERAGRAN) per tablet, Take 1 tablet by mouth daily.  , Disp: , Rfl:  SUMAtriptan (IMITREX) 50 MG tablet, 50 mg. One at onset of headache may repeat in 2 hours as needed , Disp: , Rfl: ;  Vitamin D, Ergocalciferol, (DRISDOL) 50000 UNITS CAPS, , Disp: , Rfl: ;  azithromycin (ZITHROMAX Z-PAK) 250 MG tablet, 2 tabs on the first day,  then 1 tab daily, Disp: 6 each, Rfl: 0  EXAM:  Filed Vitals:   07/28/12 1107  BP: 138/88  Pulse: 96  Temp: 98.3 F (36.8 C)    Body mass index is 25.21 kg/(m^2).  GENERAL: vitals reviewed and listed above, alert, oriented, appears well hydrated and in no acute distress  HEENT: atraumatic, conjunttiva clear, no obvious abnormalities on inspection of external nose and ears, normal appearance of ear canals and TMs, clear nasal congestion, mild post oropharyngeal erythema with PND, no tonsillar edema or exudate, no sinus TTP  NECK: no obvious masses on inspection  LUNGS: clear to auscultation bilaterally, no wheezes, rales or rhonchi, good air movement  CV: HRRR, no peripheral edema  MS: moves all extremities without noticeable abnormality  PSYCH: pleasant and cooperative, no obvious depression or anxiety  ASSESSMENT AND PLAN:  Discussed the following assessment and plan:  1. Sinusitis  azithromycin (ZITHROMAX Z-PAK) 250 MG tablet   azithromycin (ZITHROMAX Z-PAK) 250 MG tablet   -Patient advised to return or notify a doctor immediately if symptoms worsen or persist or new concerns arise.  Patient Instructions  INSTRUCTIONS FOR UPPER RESPIRATORY INFECTION:  -As we discussed, we have prescribed a new medication (AZITHROMYCIN) for you at  this appointment. We discussed the common and serious potential adverse effects of this medication and you can review these and more with the pharmacist when you pick up your medication.  Please follow the instructions for use carefully and notify us immediately if you have any problems taking this medication.  -plenty of rest and fluids  -nasal saline wash 2-3 times daily (use prepackaged nasal saline or bottled/distilled water if making your own)   -can use sinex nasal spray for drainage and nasal congestion - but do NOT use longer then 3-4 days  -can use tylenol or ibuprofen as directed for aches and sorethroat  -in the winter time,  using a humidifier at night is helpful (please follow cleaning instructions)  -if you are taking a cough medication - use only as directed, may also try a teaspoon of honey to coat the throat and throat lozenges  -for sore throat, salt water gargles can help  -follow up if you have fevers, facial pain, tooth pain, difficulty breathing or are worsening or not getting better in 5-7 days      KIM, HANNAH R.

## 2014-04-29 ENCOUNTER — Ambulatory Visit (INDEPENDENT_AMBULATORY_CARE_PROVIDER_SITE_OTHER): Payer: BC Managed Care – PPO

## 2014-04-29 ENCOUNTER — Ambulatory Visit (INDEPENDENT_AMBULATORY_CARE_PROVIDER_SITE_OTHER): Payer: BC Managed Care – PPO | Admitting: Family Medicine

## 2014-04-29 VITALS — BP 142/90 | HR 76 | Temp 98.9°F | Resp 16 | Ht 66.5 in | Wt 146.8 lb

## 2014-04-29 DIAGNOSIS — Z0000 Encounter for general adult medical examination without abnormal findings: Secondary | ICD-10-CM

## 2014-04-29 DIAGNOSIS — I10 Essential (primary) hypertension: Secondary | ICD-10-CM

## 2014-04-29 DIAGNOSIS — M542 Cervicalgia: Secondary | ICD-10-CM

## 2014-04-29 DIAGNOSIS — M79644 Pain in right finger(s): Secondary | ICD-10-CM

## 2014-04-29 LAB — COMPLETE METABOLIC PANEL WITH GFR
ALBUMIN: 4.4 g/dL (ref 3.5–5.2)
ALT: 20 U/L (ref 0–35)
AST: 17 U/L (ref 0–37)
Alkaline Phosphatase: 69 U/L (ref 39–117)
BUN: 10 mg/dL (ref 6–23)
CALCIUM: 9.5 mg/dL (ref 8.4–10.5)
CHLORIDE: 104 meq/L (ref 96–112)
CO2: 27 meq/L (ref 19–32)
Creat: 0.63 mg/dL (ref 0.50–1.10)
GLUCOSE: 95 mg/dL (ref 70–99)
POTASSIUM: 4.1 meq/L (ref 3.5–5.3)
SODIUM: 140 meq/L (ref 135–145)
TOTAL PROTEIN: 7.8 g/dL (ref 6.0–8.3)
Total Bilirubin: 0.6 mg/dL (ref 0.2–1.2)

## 2014-04-29 LAB — LIPID PANEL
Cholesterol: 215 mg/dL — ABNORMAL HIGH (ref 0–200)
HDL: 80 mg/dL (ref >39)
LDL CALC: 121 mg/dL — AB (ref 0–99)
Total CHOL/HDL Ratio: 2.7 Ratio
Triglycerides: 70 mg/dL (ref <150)
VLDL: 14 mg/dL (ref 0–40)

## 2014-04-29 LAB — RHEUMATOID FACTOR

## 2014-04-29 LAB — POCT SEDIMENTATION RATE: POCT SED RATE: 40 mm/hr — AB (ref 0–22)

## 2014-04-29 LAB — TSH: TSH: 1.394 u[IU]/mL (ref 0.350–4.500)

## 2014-04-29 MED ORDER — HYDROCHLOROTHIAZIDE 12.5 MG PO CAPS: 12.5000 mg | ORAL_CAPSULE | Freq: Every day | ORAL | Status: DC

## 2014-04-29 NOTE — Progress Notes (Addendum)
Subjective:    Patient ID: Jill Hansen, female    DOB: April 12, 1966, 48 y.o.   MRN: [id number] This chart was scribed for Merri Ray, MD by Cathie Hoops, ED Scribe. The patient was seen in Room 4. The patient's care was started at 9:20 AM.   04/29/2014  Annual Exam   HPI HPI Comments: Here for a complete physical.   Jill Hansen is a 48 y.o. female who presents to the [facility] here for her complete physical exam.  Health Maintenance  1.) Cancer Screening Mammogram in August 2015 with no significant abnormalities. Pap testing in August 2015.  Pt goes to [facility] in [place] and had normal findings with mammograms and pap testing. She was told to come back in 3 years.  2.) Elevated Blood Pressure  Here in office today. In review of her chart, she had a normal BP of 138/88 in February 2015, and normal on office visit in 2012.   Pt notes she has had new, moderate gradually worsening elevated BP for the past two weeks. Pt notes she checks her BP with an at-home wrist BP cuff 167/109, 166/106, 174/114 but has a low of 120/82. Pt notes she was previously on BP medications but notes she came off the medication due to BP normalization. She notes she has a family history of elevated BP. She notes recent weight loss. Pt notes she has associated headaches.  3.) Immunizations:  Tetanus in 2010.  Flu: Pt denies flu shot this visit. Pt notes she previously received a flu shot and got the flu 3 months after receiving the flu vaccine.  4.) Dentist: She sees Dr. Trenton Gammon and has been seen within the past 6 months.  5.) Optho: She last saw her opthalmologist more than 1 year ago.  6.) Exercise: Pt notes she walks up the stairs instead of taking elevators. She states she typically walks up 10 floors at work.  7.) Joint Pain She notes she has joint pain in her right thumb onset one month ago. Pt describes her pain as sharp. Pt denies injury or trauma. Pt  notes her mother has rheumatoid arthritis. She denies pain in any other fingers. Pt notes her pain is worsened with writing and using the computer.  8.) Neck Pain She states she has pain in the base of her neck.  9.) Food Allergies She notes she has a food allergy to eggs.  10.) Depression Screening PHQ2 negative. Pt states she feels happy most of the time. Pt notes most her time is devoted to work. She denies feeling down, depressed or hopeless.  11.) STI Testing Pt denies the need for STI testing at this time.  Pt notes she recently moved to [place] and comes to [place] during the weekends. Pt is from [place]. She is a Psychologist, forensic for [employer]. She frequently travels to [place] for work. She will fly to [place] again in 2 weeks and stay for 2 weeks.   Patient Active Problem List   Diagnosis Date Noted  . Left leg pain 10/21/2010  . Foot pain, right 10/21/2010  . 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 HYPERPROLACTINEMIA 07/16/2010  . OTHER ANTERIOR PITUITARY DISORDERS 07/02/2010  . UTI 03/19/2009  . ANEMIA, OTHER UNSPEC 12/04/2008  . VERTIGO 05/25/2007  . HYPERTENSION 12/24/2006  . HEADACHE 12/24/2006   Past Medical History  Diagnosis Date  . Hypertension   . Preeclampsia   . Thyroid disease    History reviewed. No pertinent past  surgical history. Allergies  Allergen Reactions  . Eggs Or Egg-Derived Products Swelling    Facial swelling, lips/eyes  . Penicillins     REACTION: swelling   Prior to Admission medications   Medication Sig Start Date End Date Taking? Authorizing Provider  acetaminophen (TYLENOL) 325 MG tablet Take 650 mg by mouth every 6 (six) hours as needed.      Historical Provider, MD  amitriptyline (ELAVIL) 25 MG tablet Take 25 mg by mouth at bedtime.      Historical Provider, MD  azithromycin (ZITHROMAX Z-PAK) 250 MG tablet 2 tabs on the first day, then 1 tab daily 07/28/12   Lucretia Kern, DO  diclofenac (VOLTAREN) 75 MG EC tablet Take 1 tablet  (75 mg total) by mouth 2 (two) times daily. 01/24/11   Eulas Post, MD  multivitamin [facility]) per tablet Take 1 tablet by mouth daily.      Historical Provider, MD  SUMAtriptan (IMITREX) 50 MG tablet 50 mg. One at onset of headache may repeat in 2 hours as needed     Historical Provider, MD  Vitamin D, Ergocalciferol, (DRISDOL) 50000 UNITS CAPS  11/18/10   Historical Provider, MD   History   Social History  . Marital Status: Married    Spouse Name: N/A    Number of Children: N/A  . Years of Education: N/A   Occupational History  . Not on file.   Social History Main Topics  . Smoking status: Never Smoker   . Smokeless tobacco: Not on file  . Alcohol Use: Not on file  . Drug Use: Not on file  . Sexual Activity: Not on file   Other Topics Concern  . Not on file   Social History Narrative   Review of Systems 13 point ROS, negative other than listed above and headaches, food allergies, joint pain and neck stiffness were listed on survey.    Objective:   Filed Vitals:   04/29/14 0855  BP: 142/90  Pulse: 76  Temp: 98.9 F (37.2 C)  TempSrc: Oral  Resp: 16  Height: 5' 6.5" (1.689 m)  Weight: 146 lb 12.8 oz (66.588 kg)  SpO2: 98%    Visual Acuity Screening   Right eye Left eye Both eyes  Without correction:     With correction: $RemoveBeforeDE'20/20 20/25 20/20 '[id number]$    Physical Exam  Constitutional: She is oriented to person, place, and time. She appears well-developed and well-nourished.  HENT:  Head: Normocephalic and atraumatic.  Right Ear: External ear normal.  Left Ear: External ear normal.  Mouth/Throat: Oropharynx is clear and moist.  Eyes: Conjunctivae and EOM are normal. Pupils are equal, round, and reactive to light.  Neck: Normal range of motion. Neck supple. Carotid bruit is not present. No thyromegaly present.  Cardiovascular: Normal rate, regular rhythm, normal heart sounds and intact distal pulses.  Exam reveals no gallop and no friction rub.   No murmur  heard. Pulmonary/Chest: Effort normal and breath sounds normal. No respiratory distress. She has no wheezes.  Abdominal: Soft. Bowel sounds are normal. She exhibits no pulsatile midline mass. There is no tenderness.  Musculoskeletal: Normal range of motion. She exhibits no edema or tenderness.  Right thumb FROM at the IP but slight crepitus. On initial exam there was a lock but unable to reproduce that on repeat testing.  Lymphadenopathy:    She has no cervical adenopathy.  Neurological: She is alert and oriented to person, place, and time.  Skin: Skin is warm and dry.  No rash noted.  Psychiatric: She has a normal mood and affect. Her behavior is normal. Thought content normal.  Vitals reviewed.  [facility] reading (PRIMARY) by  Dr. Carlota Raspberry: R thumb: no apparent fracture or acute findings.     Assessment & Plan:  9:41 AM- Patient informed of current plan for treatment and evaluation and agrees with plan at this time. Jill Hansen is a 48 y.o. female Annual physical exam - Plan: POCT SEDIMENTATION RATE, COMPLETE METABOLIC PANEL WITH GFR, Lipid panel, TSH, Rheumatoid factor -anticipatory guidance as below in AVS, screening labs above. Health maintenance items as above in HPI discussed/recommended as applicable. Flu shot declined. Concerns with this discussed and if changes mind - can rtc to have this done.   Essential hypertension - Plan: POCT SEDIMENTATION RATE, COMPLETE METABOLIC PANEL WITH GFR, Lipid panel, TSH, Rheumatoid factor, hydrochlorothiazide (MICROZIDE) 12.5 MG capsule  -prior on meds, but unknown name. Higher home readings than here. Start HCTZ 12.$RemoveBefor'5mg'[id number]$  QD, labs above, recheck in next 1 month. orthostatic precautions.   Thumb pain, right - Plan: DG Finger Thumb Right, POCT SEDIMENTATION RATE, COMPLETE METABOLIC PANEL WITH GFR, Lipid panel, TSH, Rheumatoid factor  -OA vs trigger. Lateral pain less likely trigger and FH of rheum arthritis. Check ESR, RF, trial of tylenol, and recheck in 1  month.   Neck pain  -sx care,  With neck care manual, tylenol as above. Recheck in next month.   Meds ordered this encounter  Medications  . hydrochlorothiazide (MICROZIDE) 12.5 MG capsule    Sig: Take 1 capsule (12.5 mg total) by mouth daily.    Dispense:  30 capsule    Refill:  1   Patient Instructions  Tylenol for thumb pain. This may be overuse from computer mouse. We will check blood tests and recheck area when you return for blood pressure assessment in next month. You should receive a call or letter about your lab results within the next week to 10 days.  Start diuretic for your blood pressure. One per day. Recheck in 1 month. Keep a record of your blood pressures outside of the office and bring them to the next office visit.  Keeping You Healthy  Get These Tests 1. Blood Pressure- Have your blood pressure checked once a year by your health care provider.  Normal blood pressure is 120/80. 2. Weight- Have your body mass index (BMI) calculated to screen for obesity.  BMI is measure of body fat based on height and weight.  You can also calculate your own BMI at GravelBags.it. 3. Cholesterol- Have your cholesterol checked every 5 years starting at age 14 then yearly starting at age 69. 5. Chlamydia, HIV, and other sexually transmitted diseases- Get screened every year until age 51, then within three months of each new sexual provider. 5. Pap Smear- Every 1-3 years; discuss with your health care provider. 6. Mammogram- Every year starting at age 18  Take these medicines  Calcium with Vitamin D-Your body needs 1200 mg of Calcium each day and [phone] IU of Vitamin D daily.  Your body can only absorb 500 mg of Calcium at a time so Calcium must be taken in 2 or 3 divided doses throughout the day.  Multivitamin with folic acid- Once daily if it is possible for you to become pregnant.  Get these Immunizations  Gardasil-Series of three doses; prevents HPV related illness  such as genital warts and cervical cancer.  Menactra-Single dose; prevents meningitis.  Tetanus shot- Every 10 years.  Flu shot-Every  year.  Take these steps 1. Do not smoke-Your healthcare provider can help you quit.  For tips on how to quit go to www.smokefree.gov or call 1-800 QUITNOW. 2. Be physically active- Exercise 5 days a week for at least 30 minutes.  If you are not already physically active, start slow and gradually work up to 30 minutes of moderate physical activity.  Examples of moderate activity include walking briskly, dancing, swimming, bicycling, etc. 3. Breast Cancer- A self breast exam every month is important for early detection of breast cancer.  For more information and instruction on self breast exams, ask your healthcare provider or https://www.patel.info/. 4. Eat a healthy diet- Eat a variety of healthy foods such as fruits, vegetables, whole grains, low fat milk, low fat cheeses, yogurt, lean meats, poultry and fish, beans, nuts, tofu, etc.  For more information go to www. Thenutritionsource.org 5. Drink alcohol in moderation- Limit alcohol intake to one drink or less per day. Never drink and drive. 6. Depression- Your emotional health is as important as your physical health.  If you're feeling down or losing interest in things you normally enjoy please talk to your healthcare provider about being screened for depression. 7. Dental visit- Brush and floss your teeth twice daily; visit your dentist twice a year. 8. Eye doctor- Get an eye exam at least every 2 years. 9. Helmet use- Always wear a helmet when riding a bicycle, motorcycle, rollerblading or skateboarding. 66. Safe sex- If you may be exposed to sexually transmitted infections, use a condom. 11. Seat belts- Seat belts can save your live; always wear one. 12. Smoke/Carbon Monoxide detectors- These detectors need to be installed on the appropriate level of your home. Replace batteries at least  once a year. 13. Skin cancer- When out in the sun please cover up and use sunscreen 15 SPF or higher. 14. Violence- If anyone is threatening or hurting you, please tell your healthcare provider.

## 2014-04-29 NOTE — Patient Instructions (Signed)
Tylenol for thumb pain. This may be overuse from computer mouse. We will check blood tests and recheck area when you return for blood pressure assessment in next month. You should receive a call or letter about your lab results within the next week to 10 days.  Start diuretic for your blood pressure. One per day. Recheck in 1 month. Keep a record of your blood pressures outside of the office and bring them to the next office visit.  Keeping You Healthy  Get These Tests 1. Blood Pressure- Have your blood pressure checked once a year by your health care provider.  Normal blood pressure is 120/80. 2. Weight- Have your body mass index (BMI) calculated to screen for obesity.  BMI is measure of body fat based on height and weight.  You can also calculate your own BMI at [employer]Bags.it. 3. Cholesterol- Have your cholesterol checked every 5 years starting at age 49 then yearly starting at age 49. 77. Chlamydia, HIV, and other sexually transmitted diseases- Get screened every year until age 49, then within three months of each new sexual provider. 5. Pap Smear- Every 1-3 years; discuss with your health care provider. 6. Mammogram- Every year starting at age 49  Take these medicines  Calcium with Vitamin D-Your body needs 1200 mg of Calcium each day and [phone] IU of Vitamin D daily.  Your body can only absorb 500 mg of Calcium at a time so Calcium must be taken in 2 or 3 divided doses throughout the day.  Multivitamin with folic acid- Once daily if it is possible for you to become pregnant.  Get these Immunizations  Gardasil-Series of three doses; prevents HPV related illness such as genital warts and cervical cancer.  Menactra-Single dose; prevents meningitis.  Tetanus shot- Every 10 years.  Flu shot-Every year.  Take these steps 1. Do not smoke-Your healthcare provider can help you quit.  For tips on how to quit go to www.smokefree.gov or call 1-800 QUITNOW. 2. Be physically  active- Exercise 5 days a week for at least 30 minutes.  If you are not already physically active, start slow and gradually work up to 30 minutes of moderate physical activity.  Examples of moderate activity include walking briskly, dancing, swimming, bicycling, etc. 3. Breast Cancer- A self breast exam every month is important for early detection of breast cancer.  For more information and instruction on self breast exams, ask your healthcare provider or https://www.patel.info/. 4. Eat a healthy diet- Eat a variety of healthy foods such as fruits, vegetables, whole grains, low fat milk, low fat cheeses, yogurt, lean meats, poultry and fish, beans, nuts, tofu, etc.  For more information go to www. Thenutritionsource.org 5. Drink alcohol in moderation- Limit alcohol intake to one drink or less per day. Never drink and drive. 6. Depression- Your emotional health is as important as your physical health.  If you're feeling down or losing interest in things you normally enjoy please talk to your healthcare provider about being screened for depression. 7. Dental visit- Brush and floss your teeth twice daily; visit your dentist twice a year. 8. Eye doctor- Get an eye exam at least every 2 years. 9. Helmet use- Always wear a helmet when riding a bicycle, motorcycle, rollerblading or skateboarding. 30. Safe sex- If you may be exposed to sexually transmitted infections, use a condom. 11. Seat belts- Seat belts can save your live; always wear one. 12. Smoke/Carbon Monoxide detectors- These detectors need to be installed on the appropriate level of  your home. Replace batteries at least once a year. 13. Skin cancer- When out in the sun please cover up and use sunscreen 15 SPF or higher. 14. Violence- If anyone is threatening or hurting you, please tell your healthcare provider.

## 2014-04-30 ENCOUNTER — Telehealth: Payer: Self-pay | Admitting: *Deleted

## 2014-04-30 ENCOUNTER — Telehealth: Payer: Self-pay

## 2014-04-30 NOTE — Telephone Encounter (Signed)
The patient was returning a call from yesterday, which she believes was pertaining to the labwork that was drawn on 04/29/14.  She requested a call back at [phone].

## 2014-04-30 NOTE — Telephone Encounter (Signed)
Patient concerned about a bruise on the LEFT arm, antecubital fossa, at the location that blood was drawn yesterday.  No erythema, edema, red streaking. Minimal increased warmth. Ecchymosis noted.  Tender to palpation.  Reassured. Anticipatory guidance provided. Return for worsening pain, development of redness, red streaking, swelling or fever.

## 2014-04-30 NOTE — Telephone Encounter (Signed)
Pt was here because of bruise on arm and was here to get it looked at.  Pt evaluated by Chelle.

## 2014-05-01 NOTE — Telephone Encounter (Signed)
I did not call patient, but will place lab note to call with results from last ov - see lab note.

## 2014-05-01 NOTE — Telephone Encounter (Signed)
Dr. Carlota Raspberry- Did you call pt with information in regards to the Sed Rate resulted at 40? No other reason for call noted.

## 2014-05-02 NOTE — Telephone Encounter (Signed)
LM for rtn call to LAB for results- ask to speak to the lab  Notes Recorded by Wendie Agreste, MD on 05/01/2014 at 9:55 PM Call patient. Kidney tests, liver function tests, electrolytes, thyroid test, rheumatoid factor at last visit were in normal range. Cholesterol mildly elevated - diet and exercise as initial approach and recheck levels in 6 months. Inflammation test elevated at 40.  Follow up as scheduled in next month to discuss further. Tylenol otc prn in the meantime.

## 2014-06-18 ENCOUNTER — Other Ambulatory Visit: Payer: Self-pay | Admitting: Family Medicine

## 2015-02-24 ENCOUNTER — Other Ambulatory Visit: Payer: Self-pay | Admitting: Family Medicine

## 2015-05-20 IMAGING — CR DG FINGER THUMB 2+V*R*
1 series · 1 of 1 positions shown · non-contrast
Comparison: None.

CLINICAL DATA: Right thumb pain for 1 month, no trauma, initial
encounter

EXAM:
RIGHT THUMB 2+V

[PA]
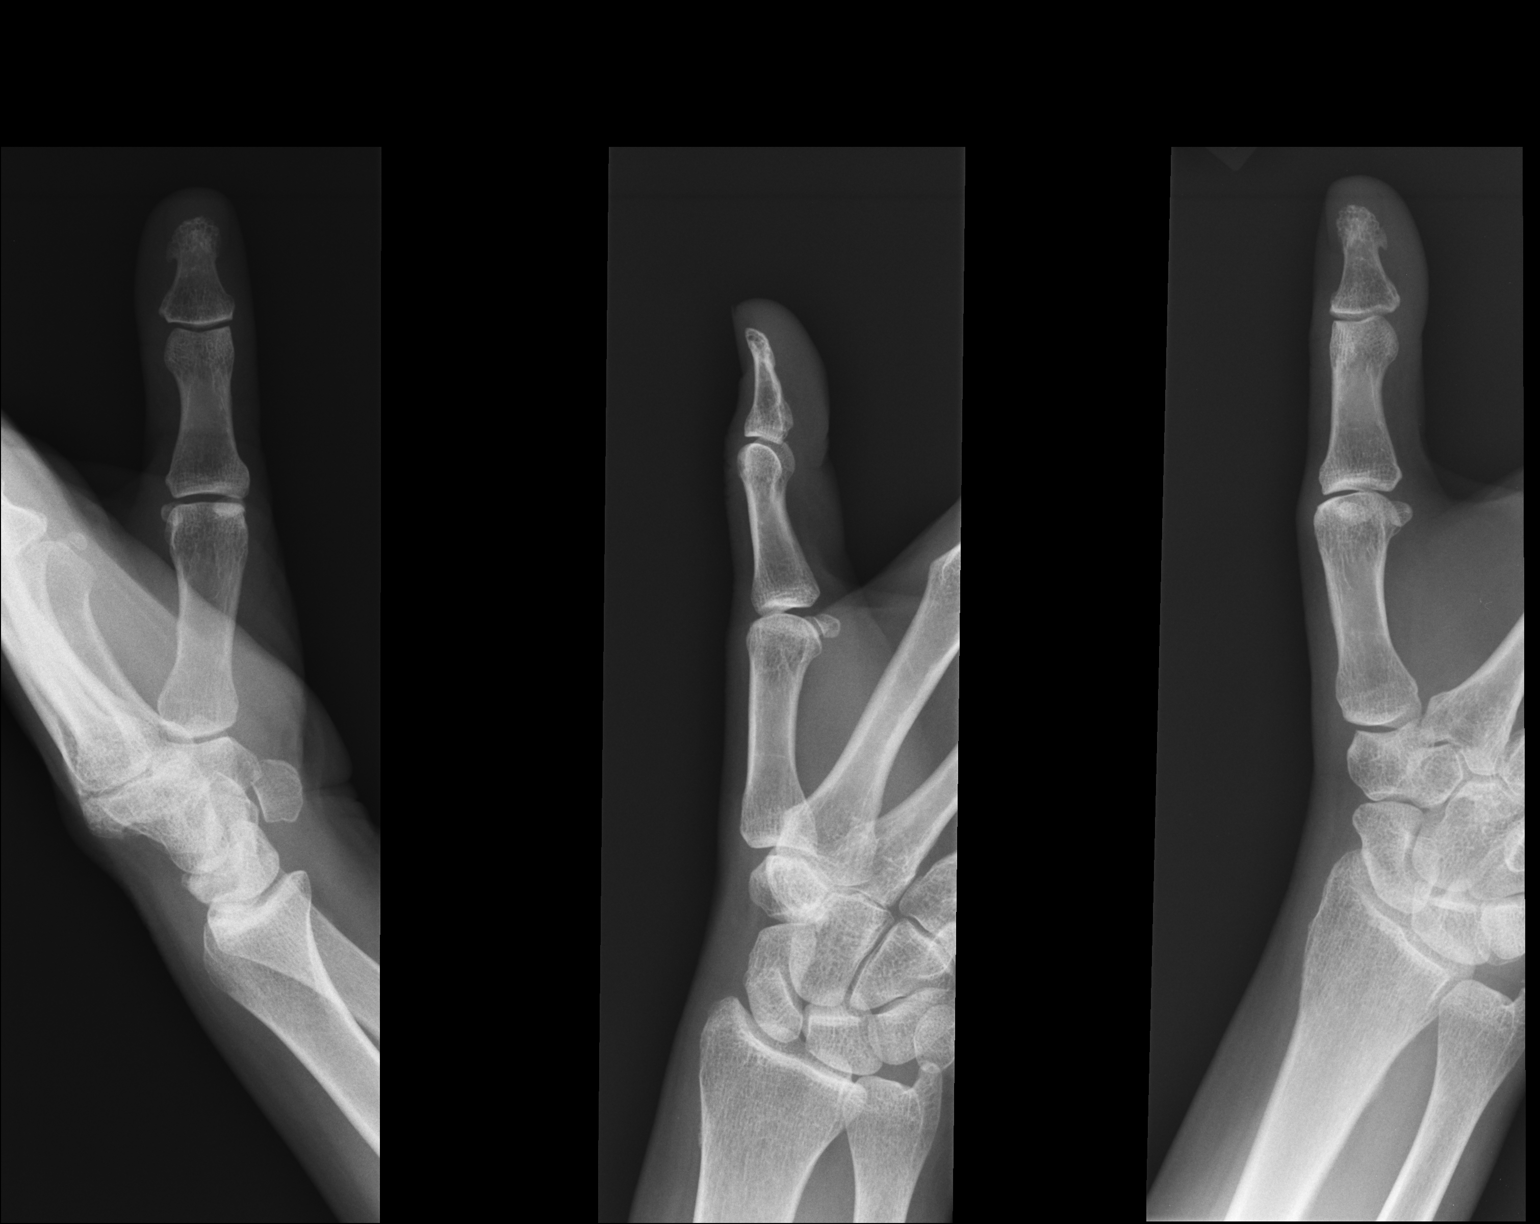

[1 of 1 positions shown; findings below may reference images not displayed]

FINDINGS: There is no evidence of fracture or dislocation. There is no
evidence of arthropathy or other focal bone abnormality. Soft
tissues are unremarkable
IMPRESSION: No acute abnormality noted.
